# Patient Record
Sex: Male | Born: 1990 | Race: White | Hispanic: No | Marital: Married | Smoking: Never smoker
Health system: Southern US, Community
[De-identification: ages and names within clinical notes are randomized; demographics above are authoritative.]

## PROBLEM LIST (undated history)

## (undated) DIAGNOSIS — N289 Disorder of kidney and ureter, unspecified: Secondary | ICD-10-CM

## (undated) HISTORY — PX: APPENDECTOMY: SHX54

---

## 2019-06-27 ENCOUNTER — Other Ambulatory Visit: Payer: Self-pay

## 2019-06-27 ENCOUNTER — Encounter (HOSPITAL_COMMUNITY): Payer: Self-pay

## 2019-06-27 ENCOUNTER — Emergency Department (HOSPITAL_COMMUNITY)
Admission: EM | Admit: 2019-06-27 | Discharge: 2019-06-27 | Disposition: A | Discharge status: Home / Self Care | Payer: PRIVATE HEALTH INSURANCE | Attending: Emergency Medicine | Admitting: Emergency Medicine

## 2019-06-27 ENCOUNTER — Emergency Department (HOSPITAL_COMMUNITY): Payer: PRIVATE HEALTH INSURANCE

## 2019-06-27 DIAGNOSIS — N202 Calculus of kidney with calculus of ureter: Secondary | ICD-10-CM | POA: Diagnosis not present

## 2019-06-27 DIAGNOSIS — N201 Calculus of ureter: Secondary | ICD-10-CM

## 2019-06-27 DIAGNOSIS — R109 Unspecified abdominal pain: Secondary | ICD-10-CM | POA: Diagnosis present

## 2019-06-27 HISTORY — DX: Disorder of kidney and ureter, unspecified: N28.9

## 2019-06-27 LAB — URINALYSIS, ROUTINE W REFLEX MICROSCOPIC
Bilirubin Urine: NEGATIVE
Glucose, UA: NEGATIVE mg/dL
Ketones, ur: NEGATIVE mg/dL
Leukocytes,Ua: NEGATIVE
Nitrite: NEGATIVE
Protein, ur: 30 mg/dL — AB
RBC / HPF: >50 RBC/hpf — ABNORMAL HIGH (ref 0–5)
Specific Gravity, Urine: 1.018 (ref 1.005–1.030)
pH: 7 (ref 5.0–8.0)

## 2019-06-27 MED ORDER — HYDROCODONE-ACETAMINOPHEN 5-325 MG PO TABS: 1.0000 | ORAL_TABLET | ORAL | 0 refills | Status: AC | PRN

## 2019-06-27 MED ORDER — HYDROMORPHONE HCL 2 MG/ML IJ SOLN
2.0000 mg | Freq: Once | INTRAMUSCULAR | Status: AC
  Administered 2019-06-27: 2 mg via INTRAVENOUS
  Filled 2019-06-27: qty 1

## 2019-06-27 MED ORDER — ONDANSETRON HCL 8 MG PO TABS: 8.0000 mg | ORAL_TABLET | Freq: Three times a day (TID) | ORAL | 0 refills | Status: AC | PRN

## 2019-06-27 MED ORDER — ONDANSETRON HCL 8 MG PO TABS: 8.0000 mg | ORAL_TABLET | Freq: Three times a day (TID) | ORAL | 0 refills | Status: DC | PRN

## 2019-06-27 MED ORDER — TAMSULOSIN HCL 0.4 MG PO CAPS: ORAL_CAPSULE | ORAL | 0 refills | Status: AC

## 2019-06-27 MED ORDER — HYDROCODONE-ACETAMINOPHEN 5-325 MG PO TABS: 1.0000 | ORAL_TABLET | ORAL | 0 refills | Status: DC | PRN

## 2019-06-27 MED ORDER — ONDANSETRON 8 MG PO TBDP
8.0000 mg | ORAL_TABLET | Freq: Once | ORAL | Status: AC
  Administered 2019-06-27: 8 mg via ORAL
  Filled 2019-06-27: qty 1

## 2019-06-27 NOTE — Discharge Instructions (Addendum)
You have a 4 mm stone in the right ureter, which is causing your pain.  We expect the stone to pass in 2 or 3 days.  We are prescribing a pain reliever and a medicine to relax the tube to allow the stone to pass.  Do not drive, when taking the pain reliever.  Return here, if needed, for problems.

## 2019-06-27 NOTE — ED Triage Notes (Signed)
Pt reports last kidney stone 10 years ago.  Pt reports this pain started on Wednesday, has been intermittent until last night, constant with vomiting.

## 2019-06-27 NOTE — ED Provider Notes (Addendum)
[facility] EMERGENCY DEPARTMENT Provider Note   CSN: [id number] Arrival date & time: 06/27/19  6063     History Chief Complaint  Patient presents with  . Flank Pain    Alfred Myers is a 28 y.o. male.  HPI He presents for evaluation of right flank pain which started 3 days ago, at which time it was intermittent.  It became constant around 10 PM tonight.  It is unrelenting and severe, "10/10."  He has had some occasional dark colors noted in the urine but "it is now clear."  Remote history of kidney stone.  No dysuria.  He has had some urinary urgency.  No fever, chills, cough or shortness of breath.  There are no other known modifying factors.    Past Medical History:  Diagnosis Date  . Renal disorder     There are no problems to display for this patient.   Past Surgical History:  Procedure Laterality Date  . APPENDECTOMY         No family history on file.  Social History   Tobacco Use  . Smoking status: Never Smoker  . Smokeless tobacco: Never Used  Substance Use Topics  . Alcohol use: Yes  . Drug use: Never    Home Medications Prior to Admission medications   Medication Sig Start Date End Date Taking? Authorizing Provider  HYDROcodone-acetaminophen (NORCO/VICODIN) 5-325 MG tablet Take 1-2 tablets by mouth every 4 (four) hours as needed for moderate pain. 06/27/19   Daleen Bo, MD  tamsulosin [facility]) 0.4 MG CAPS capsule 1 q HS to aid stone passage 06/27/19   Daleen Bo, MD  tamsulosin [facility]) 0.4 MG CAPS capsule 1 q HS to aid stone passage 06/27/19   Daleen Bo, MD    Allergies    Patient has no known allergies.  Review of Systems   Review of Systems  All other systems reviewed and are negative.   Physical Exam Updated Vital Signs BP 104/80 (BP Location: Right Arm)   Pulse (!) 58   Temp 98 F (36.7 C) (Oral)   Resp 18   Ht 6' (1.829 m)   Wt 104.3 kg   SpO2 99%   BMI 31.19 kg/m   Physical Exam Vitals and nursing note  reviewed.  Constitutional:      General: He is in acute distress (Uncomfortable).     Appearance: He is well-developed. He is not ill-appearing, toxic-appearing or diaphoretic.  HENT:     Head: Normocephalic and atraumatic.     Right Ear: External ear normal.     Left Ear: External ear normal.  Eyes:     Conjunctiva/sclera: Conjunctivae normal.     Pupils: Pupils are equal, round, and reactive to light.  Neck:     Trachea: Phonation normal.  Cardiovascular:     Rate and Rhythm: Normal rate.  Pulmonary:     Effort: Pulmonary effort is normal.  Abdominal:     Palpations: Abdomen is soft.     Tenderness: There is no abdominal tenderness.  Genitourinary:    Comments: No costovertebral angle tenderness with percussion. Musculoskeletal:        General: Normal range of motion.     Cervical back: Normal range of motion and neck supple.  Skin:    General: Skin is warm and dry.  Neurological:     Mental Status: He is alert and oriented to person, place, and time.     Cranial Nerves: No cranial nerve deficit.     Sensory: No  sensory deficit.     Motor: No abnormal muscle tone.     Coordination: Coordination normal.  Psychiatric:        Mood and Affect: Mood normal.        Behavior: Behavior normal.        Thought Content: Thought content normal.        Judgment: Judgment normal.     ED Results / Procedures / Treatments   Labs (all labs ordered are listed, but only abnormal results are displayed) Labs Reviewed  URINALYSIS, ROUTINE W REFLEX MICROSCOPIC - Abnormal; Notable for the following components:      Result Value   APPearance CLOUDY (*)    Hgb urine dipstick LARGE (*)    Protein, ur 30 (*)    RBC / HPF >50 (*)    Bacteria, UA RARE (*)    All other components within normal limits    EKG None  Radiology CT Renal Stone Study  Result Date: 06/27/2019 CLINICAL DATA:  Right flank pain.  Possible kidney stone. EXAM: CT ABDOMEN AND PELVIS WITHOUT CONTRAST TECHNIQUE:  Multidetector CT imaging of the abdomen and pelvis was performed following the standard protocol without IV contrast. COMPARISON:  None. FINDINGS: Lower chest: Lung bases are normal. Hepatobiliary: Normal. Pancreas: Normal. Spleen: Normal. Adrenals/Urinary Tract: Adrenal glands are normal. Kidneys normal size. 2 mm stone over the upper pole right kidney. There is mild-to-moderate right-sided hydronephrosis. Mild dilatation of the right ureter as there is a 4 mm stone over the junction of the mid to distal right ureter. Left ureter and bladder are normal. Stomach/Bowel: Stomach and small bowel are normal. Previous appendectomy. Colon is normal. Vascular/Lymphatic: Aorta is normal.  No evidence of adenopathy. Reproductive: Normal. Other: No free fluid or focal inflammatory change. Musculoskeletal: Normal. IMPRESSION: Right-sided nephrolithiasis. 4 mm stone over the junction of a mid to distal right ureter causing low-grade obstruction. Electronically Signed   By: Elberta Fortisaniel  Boyle M.D.   On: 06/27/2019 06:46    Procedures Procedures (including critical care time)  Medications Ordered in ED Medications  ondansetron (ZOFRAN-ODT) disintegrating tablet 8 mg (8 mg Oral Given 06/27/19 0616)  HYDROmorphone (DILAUDID) injection 2 mg (2 mg Intravenous Given 06/27/19 [id number])    ED Course  I have reviewed the triage vital signs and the nursing notes.  Pertinent labs & imaging results that were available during my care of the patient were reviewed by me and considered in my medical decision making (see chart for details).  Clinical Course as of Jun 26 737  Fri Jun 27, 2019  0621 Normal except presence of hemoglobin, protein, red cells, and rare bacteria  Urinalysis, Routine w reflex microscopic(!) [EW]    Clinical Course User Index [EW] Mancel BaleWentz, Cherrie Franca, MD   MDM Rules/Calculators/A&P                       Patient Vitals for the past 24 hrs:  BP Temp Temp src Pulse Resp SpO2 Height Weight  06/27/19 0601  104/80 98 F (36.7 C) Oral (!) 58 18 99 % -- --  06/27/19 0557 -- -- -- -- -- -- 6' (1.829 m) 104.3 kg    7:06 AM Reevaluation with update and discussion. After initial assessment and treatment, an updated evaluation reveals he is comfortable at this time and has no further complaints.  Findings discussed and questions answered. Mancel BaleElliott Heyward Douthit   Medical Decision Making: Right flank pain secondary to 4 mm stone in the mid ureter.  This has a moderately good chance of passing.  Doubt UTI.  Doubt impending vascular collapse.  Lorrie Strauch was evaluated in Emergency Department on 06/27/2019 for the symptoms described in the history of present illness. He was evaluated in the context of the global COVID-19 pandemic, which necessitated consideration that the patient might be at risk for infection with the SARS-CoV-2 virus that causes COVID-19. Institutional protocols and algorithms that pertain to the evaluation of patients at risk for COVID-19 are in a state of rapid change based on information released by regulatory bodies including the CDC and federal and state organizations. These policies and algorithms were followed during the patient's care in the ED.   CRITICAL CARE-no Performed by: Mancel Bale   Nursing Notes Reviewed/ Care Coordinated Applicable Imaging Reviewed Interpretation of Laboratory Data incorporated into ED treatment  The patient appears reasonably screened and/or stabilized for discharge and I doubt any other medical condition or other [facility] requiring further screening, evaluation, or treatment in the ED at this time prior to discharge.  Plan: Home Medications-OTC analgesia if needed; Home Treatments-gradual advance diet and activity; return here if the recommended treatment, does not improve the symptoms; Recommended follow up-urology follow-up 1 week and as needed    Final Clinical Impression(s) / ED Diagnoses Final diagnoses:  Ureterolithiasis  Flank pain    Rx / DC  Orders ED Discharge Orders         Ordered    HYDROcodone-acetaminophen (NORCO) 5-325 MG tablet  Every 4 hours PRN,   Status:  Discontinued     06/27/19 0704    tamsulosin (FLOMAX) 0.4 MG CAPS capsule     06/27/19 0704    HYDROcodone-acetaminophen (NORCO/VICODIN) 5-325 MG tablet  Every 4 hours PRN     06/27/19 0737    tamsulosin (FLOMAX) 0.4 MG CAPS capsule     06/27/19 0737           Mancel Bale, MD 06/27/19 0272    Mancel Bale, MD 06/27/19 5366    Mancel Bale, MD 06/27/19 [phone]

## 2020-09-21 IMAGING — CT CT RENAL STONE PROTOCOL
2 of 4 series · 17 of 46 positions shown, 19 images · non-contrast
Comparison: None.

CLINICAL DATA: Right flank pain.  Possible kidney stone.

EXAM:
CT ABDOMEN AND PELVIS WITHOUT CONTRAST
TECHNIQUE: Multidetector CT imaging of the abdomen and pelvis was performed
following the standard protocol without IV contrast.

[Series 2: axial st · axial · 0.98mm/px · z∈[+928,+1378]mm · 14 of 102 slices shown, 16 images]
[im 6/102  soft-tissue]
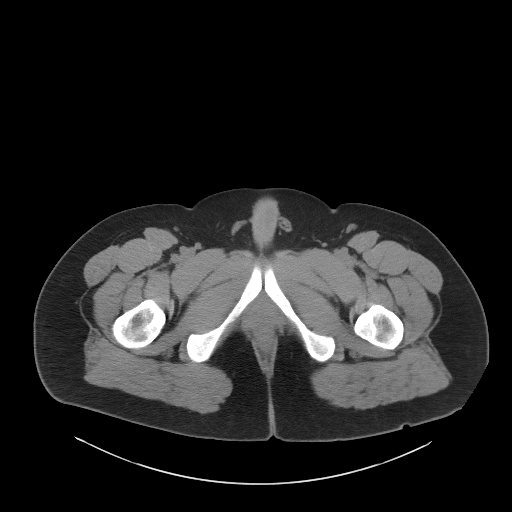
[im 6/102  bone]
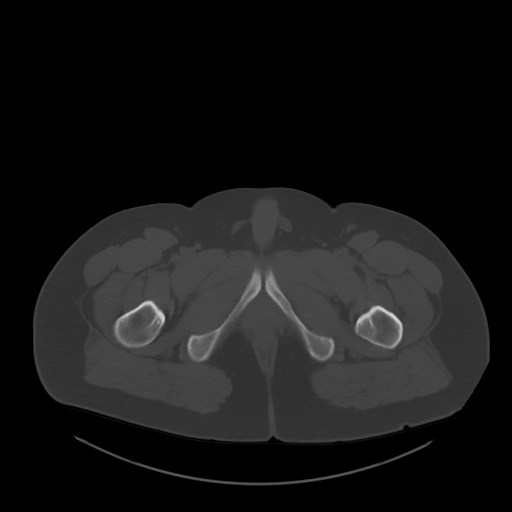
[im 12/102  soft-tissue]
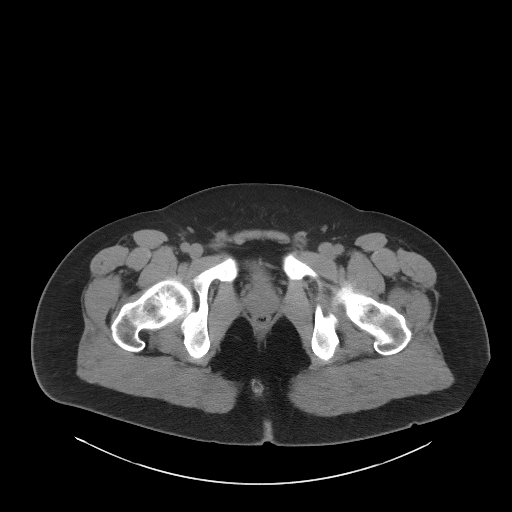
[im 18/102  soft-tissue]
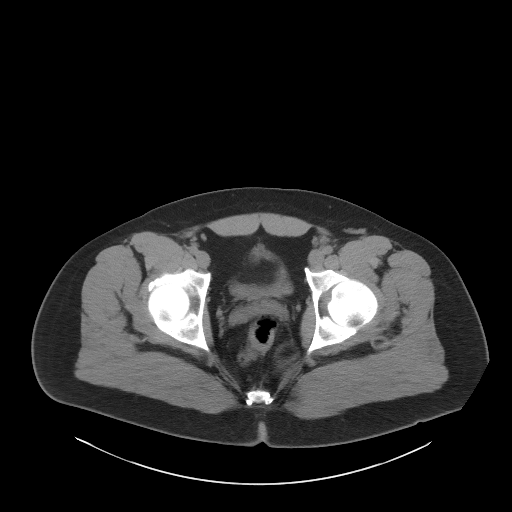
[im 30/102  soft-tissue]
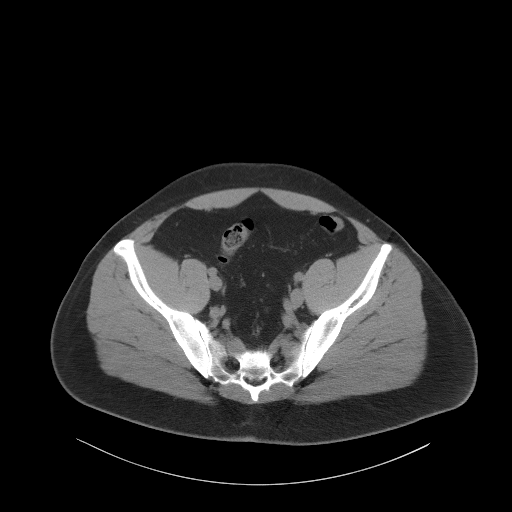
[im 36/102  soft-tissue]
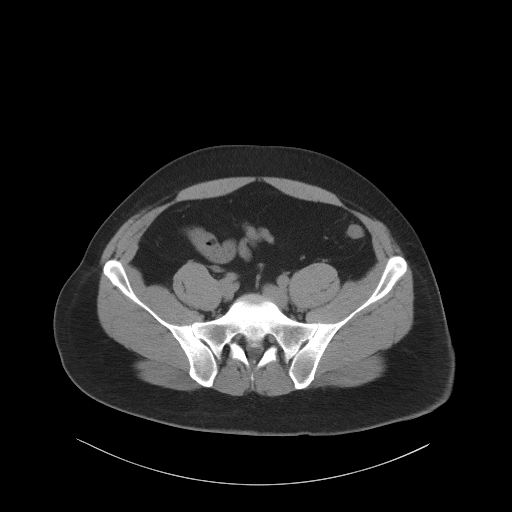
[im 42/102  soft-tissue]
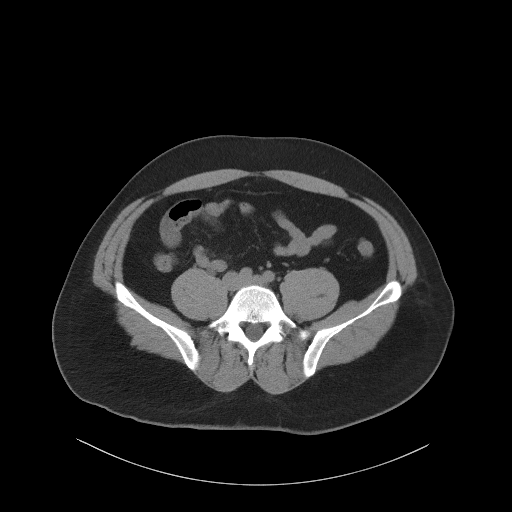
[im 48/102  soft-tissue]
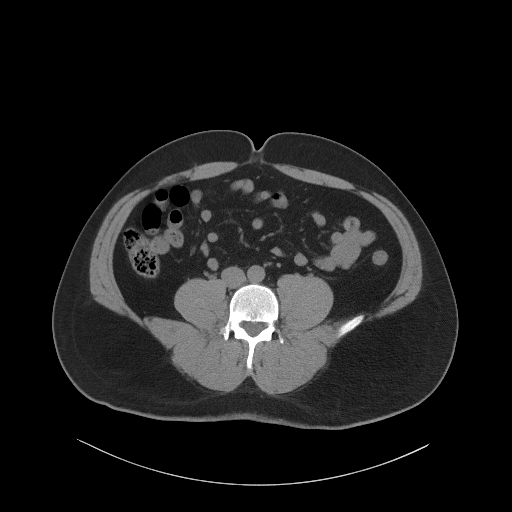
[im 54/102  soft-tissue]
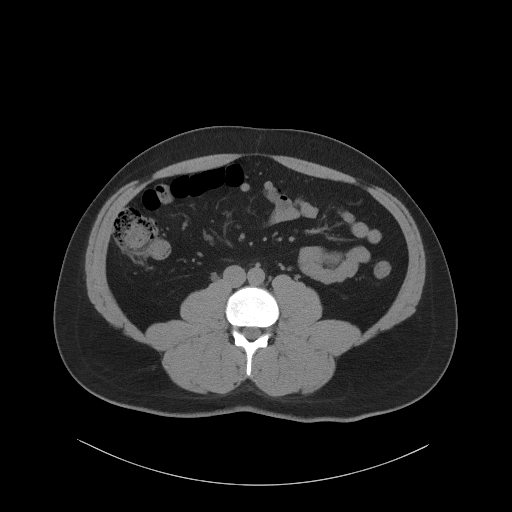
[im 60/102  soft-tissue]
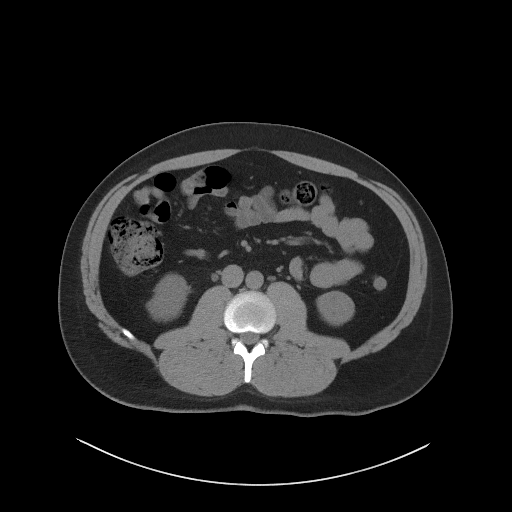
[im 60/102  bone]
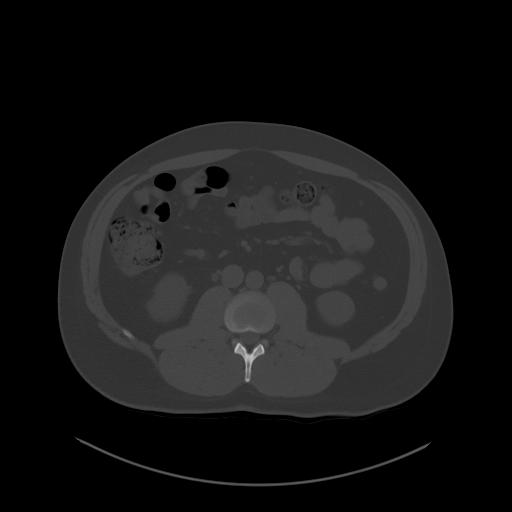
[im 66/102  soft-tissue]
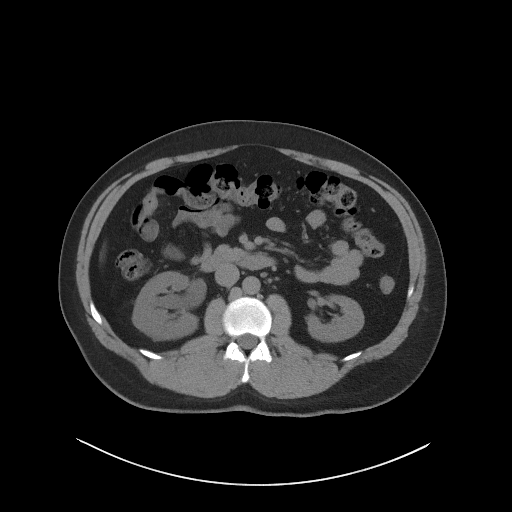
[im 78/102  soft-tissue]
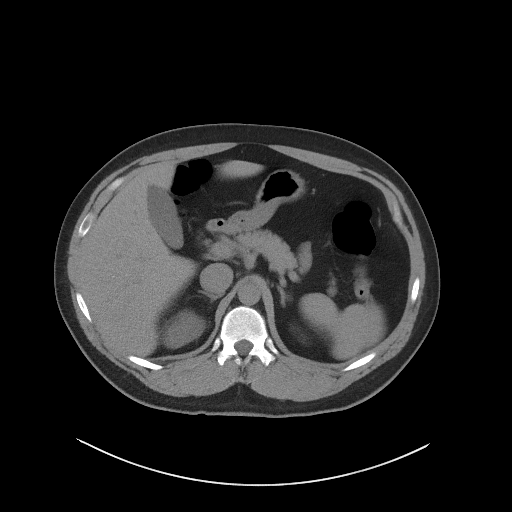
[im 84/102  soft-tissue]
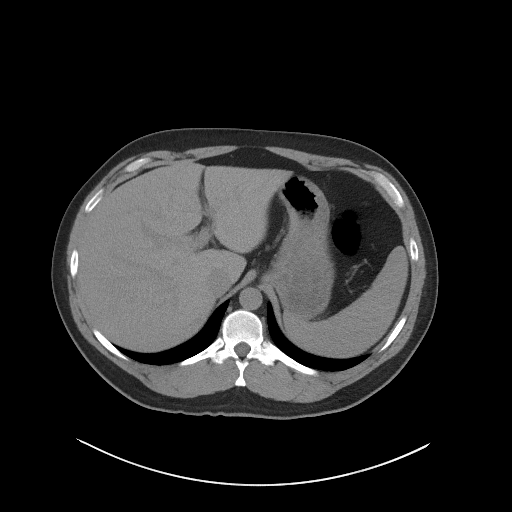
[im 90/102  soft-tissue]
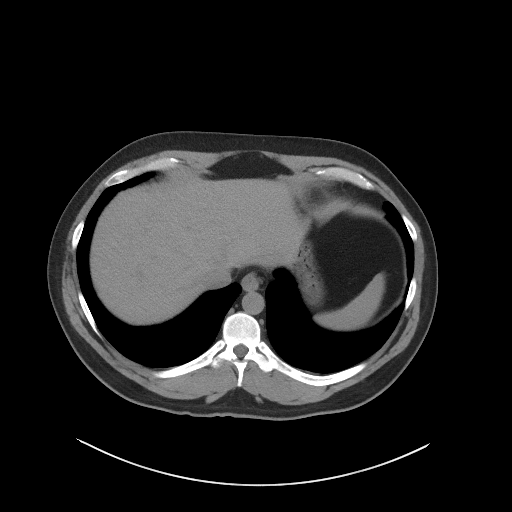
[im 96/102  soft-tissue]
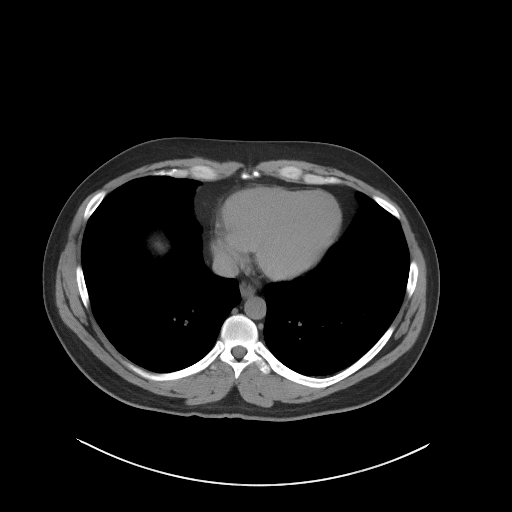

[Series 5: coronal st · coronal · 0.85mm/px · 3 of 101 slices shown]
[im 34/101  soft-tissue]
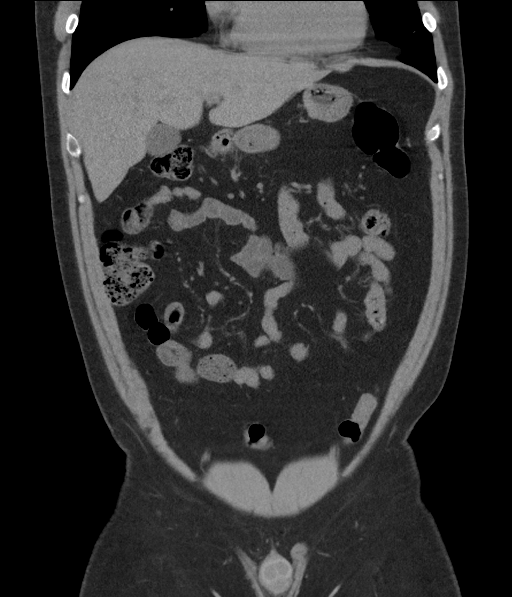
[im 45/101  soft-tissue]
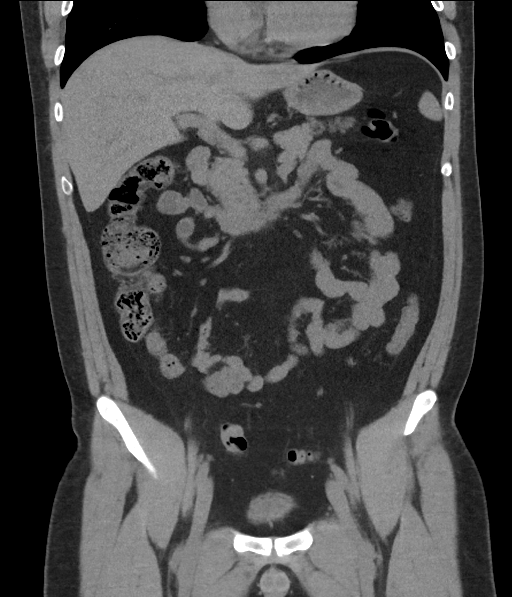
[im 56/101  soft-tissue]
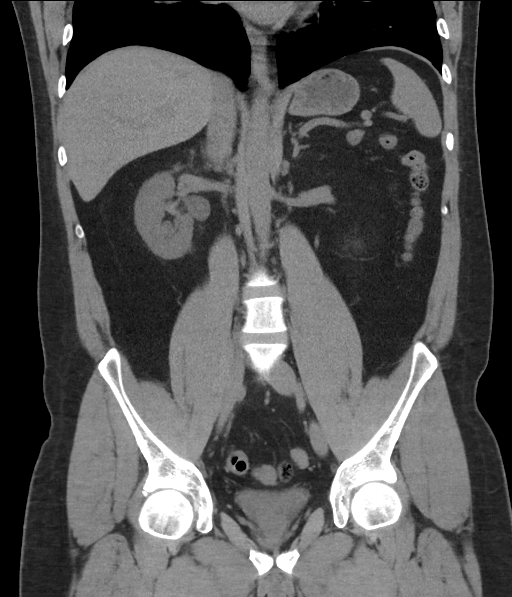

[17 of 46 positions shown; findings below may reference images not displayed]

FINDINGS: Lower chest: Lung bases are normal.

Hepatobiliary: Normal.

Pancreas: Normal.

Spleen: Normal.

Adrenals/Urinary Tract: Adrenal glands are normal. Kidneys normal
size. 2 mm stone over the upper pole right kidney. There is
mild-to-moderate right-sided hydronephrosis. Mild dilatation of the
right ureter as there is a 4 mm stone over the junction of the mid
to distal right ureter. Left ureter and bladder are normal.

Stomach/Bowel: Stomach and small bowel are normal. Previous
appendectomy. Colon is normal.

Vascular/Lymphatic: Aorta is normal.  No evidence of adenopathy.

Reproductive: Normal.

Other: No free fluid or focal inflammatory change.

Musculoskeletal: Normal.
IMPRESSION: Right-sided nephrolithiasis. 4 mm stone over the junction of a mid
to distal right ureter causing low-grade obstruction.
